# Patient Record
Sex: Female | Born: 1975 | Race: Black or African American | Hispanic: No | Marital: Married | Smoking: Never smoker
Health system: Southern US, Community
[De-identification: ages and names within clinical notes are randomized; demographics above are authoritative.]

## PROBLEM LIST (undated history)

## (undated) DIAGNOSIS — J45909 Unspecified asthma, uncomplicated: Secondary | ICD-10-CM

---

## 2005-01-15 ENCOUNTER — Emergency Department (HOSPITAL_COMMUNITY): Admission: EM | Admit: 2005-01-15 | Discharge: 2005-01-15 | Payer: Self-pay | Admitting: Family Medicine

## 2005-02-27 ENCOUNTER — Emergency Department: Payer: Self-pay | Admitting: Emergency Medicine

## 2005-03-23 ENCOUNTER — Emergency Department: Payer: Self-pay | Admitting: Emergency Medicine

## 2005-03-24 ENCOUNTER — Emergency Department (HOSPITAL_COMMUNITY): Admission: EM | Admit: 2005-03-24 | Discharge: 2005-03-24 | Payer: Self-pay | Admitting: Emergency Medicine

## 2005-09-28 ENCOUNTER — Emergency Department (HOSPITAL_COMMUNITY): Admission: EM | Admit: 2005-09-28 | Discharge: 2005-09-28 | Payer: Self-pay | Admitting: Family Medicine

## 2005-11-09 ENCOUNTER — Other Ambulatory Visit: Admission: RE | Admit: 2005-11-09 | Discharge: 2005-11-09 | Payer: Self-pay | Admitting: Obstetrics and Gynecology

## 2009-05-25 ENCOUNTER — Inpatient Hospital Stay (HOSPITAL_COMMUNITY): Admission: RE | Admit: 2009-05-25 | Discharge: 2009-05-28 | Payer: Self-pay | Admitting: Obstetrics and Gynecology

## 2009-05-30 ENCOUNTER — Encounter: Admission: RE | Admit: 2009-05-30 | Discharge: 2009-06-29 | Payer: Self-pay | Admitting: Obstetrics and Gynecology

## 2009-06-30 ENCOUNTER — Encounter: Admission: RE | Admit: 2009-06-30 | Discharge: 2009-07-29 | Payer: Self-pay | Admitting: Obstetrics and Gynecology

## 2009-07-30 ENCOUNTER — Encounter: Admission: RE | Admit: 2009-07-30 | Discharge: 2009-08-29 | Payer: Self-pay | Admitting: Obstetrics and Gynecology

## 2009-08-30 ENCOUNTER — Encounter: Admission: RE | Admit: 2009-08-30 | Discharge: 2009-09-06 | Payer: Self-pay | Admitting: Obstetrics and Gynecology

## 2011-01-13 LAB — RPR: RPR Ser Ql: NONREACTIVE

## 2011-01-13 LAB — CBC
Hemoglobin: 10.9 g/dL — ABNORMAL LOW (ref 12.0–15.0)
MCV: 97 fL (ref 78.0–100.0)
Platelets: 191 10*3/uL (ref 150–400)
RBC: 3.27 MIL/uL — ABNORMAL LOW (ref 3.87–5.11)
RDW: 14.5 % (ref 11.5–15.5)
WBC: 7.6 10*3/uL (ref 4.0–10.5)
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
## 2012-01-03 ENCOUNTER — Ambulatory Visit (INDEPENDENT_AMBULATORY_CARE_PROVIDER_SITE_OTHER): Payer: Managed Care, Other (non HMO) | Admitting: Obstetrics and Gynecology

## 2012-01-03 DIAGNOSIS — Z202 Contact with and (suspected) exposure to infections with a predominantly sexual mode of transmission: Secondary | ICD-10-CM

## 2012-01-03 DIAGNOSIS — Z01419 Encounter for gynecological examination (general) (routine) without abnormal findings: Secondary | ICD-10-CM

## 2012-07-03 ENCOUNTER — Encounter: Payer: Self-pay | Admitting: Emergency Medicine

## 2012-07-03 ENCOUNTER — Emergency Department (INDEPENDENT_AMBULATORY_CARE_PROVIDER_SITE_OTHER)
Admission: EM | Admit: 2012-07-03 | Discharge: 2012-07-03 | Disposition: A | Discharge status: Home / Self Care | Payer: BC Managed Care – PPO | Source: Home / Self Care | Attending: Family Medicine | Admitting: Family Medicine

## 2012-07-03 DIAGNOSIS — S76219A Strain of adductor muscle, fascia and tendon of unspecified thigh, initial encounter: Secondary | ICD-10-CM

## 2012-07-03 DIAGNOSIS — S93409A Sprain of unspecified ligament of unspecified ankle, initial encounter: Secondary | ICD-10-CM

## 2012-07-03 DIAGNOSIS — IMO0002 Reserved for concepts with insufficient information to code with codable children: Secondary | ICD-10-CM

## 2012-07-03 HISTORY — DX: Unspecified asthma, uncomplicated: J45.909

## 2012-07-03 MED ORDER — HYDROCODONE-ACETAMINOPHEN 5-500 MG PO TABS: 1.0000 | ORAL_TABLET | Freq: Every evening | ORAL | Status: AC | PRN

## 2012-07-03 MED ORDER — CYCLOBENZAPRINE HCL 10 MG PO TABS: 10.0000 mg | ORAL_TABLET | Freq: Two times a day (BID) | ORAL | Status: AC | PRN

## 2012-07-03 NOTE — ED Provider Notes (Signed)
History     CSN: [id number]  Arrival date & time 07/03/12  1859   First MD Initiated Contact with Patient 07/03/12 1933      Chief Complaint  Patient presents with  . Leg Pain      HPI Comments: Patient was stepping up onto a curb 8 days ago when she twisted her left ankle, resulting in mild swelling and pain.  This rapidly resolved over the next several days.  She wore high heeled shoes after her ankle pain resolved, and today she developed pain in her left inguinal area and left medial thigh, worse when standing/walking.  Patient is a 36 y.o. female presenting with leg pain. The history is provided by the patient.  Leg Pain  The incident occurred 6 to 12 hours ago. The incident occurred at work. Injury mechanism: previous left ankle sprain. The pain is present in the left thigh. The quality of the pain is described as throbbing. The pain is mild. The pain has been intermittent since onset. Associated symptoms include inability to bear weight. Pertinent negatives include no numbness, no loss of motion, no muscle weakness, no loss of sensation and no tingling. The symptoms are aggravated by bearing weight. She has tried NSAIDs for the symptoms. The treatment provided mild relief.    Past Medical History  Diagnosis Date  . Asthma     History reviewed. No pertinent past surgical history.  No pertinent family history  History  Substance Use Topics  . Smoking status: Never Smoker   . Smokeless tobacco: Not on file  . Alcohol Use: Yes    OB History    Grav Para Term Preterm Abortions TAB SAB Ect Mult Living                  Review of Systems  Neurological: Negative for tingling and numbness.  All other systems reviewed and are negative.    Allergies  Latex  Home Medications   Current Outpatient Rx  Name Route Sig Dispense Refill  . ALBUTEROL SULFATE HFA 108 (90 BASE) MCG/ACT IN AERS Inhalation Inhale 2 puffs into the lungs every 6 (six) hours as needed.    .  CYCLOBENZAPRINE HCL 10 MG PO TABS Oral Take 1 tablet (10 mg total) by mouth 2 (two) times daily as needed for muscle spasms. 20 tablet 0  . HYDROCODONE-ACETAMINOPHEN 5-500 MG PO TABS Oral Take 1 tablet by mouth at bedtime as needed for pain. 10 tablet 0    BP 112/73  Pulse 78  Temp 98 F (36.7 C) (Oral)  Resp 18  Ht 5\' 8"  (1.727 m)  Wt 162 lb (73.483 kg)  BMI 24.63 kg/m2  SpO2 100%  LMP 07/02/2012  Physical Exam  Constitutional: She is oriented to person, place, and time. She appears well-developed and well-nourished. No distress.  HENT:  Head: Atraumatic.  Eyes: Conjunctivae normal are normal. Pupils are equal, round, and reactive to light.  Musculoskeletal: She exhibits tenderness.       Left ankle: Normal.       Left upper leg: She exhibits tenderness. She exhibits no bony tenderness, no swelling, no edema, no deformity and no laceration.       Legs:      There is mild tenderness along medial aspect of left thigh.  Left hip has full range of motion.    Neurological: She is alert and oriented to person, place, and time.  Skin: Skin is warm and dry. No rash noted.  ED Course  Procedures  none      1. Groin strain   2. Ankle sprain, resolving       MDM  Begin Flexeril.  Rx for Lortab at bedtime. Begin Ibuprofen 200mg , 4 tabs every 8 hours with food.  Apply ice pack several times daily.  Begin exercises as per instruction sheets ([facility] information and instruction handout given)  Followup with Dr. Rodney Langton if not improving 7 to 10 days.        Lattie Haw, MD 07/04/12 [phone]

## 2012-07-03 NOTE — ED Notes (Signed)
Reports slipping off curb and twisting left ankle 8 days ago; there was some edema and throbbing but it seemed to be improving. Today she began having sharp pain from left hip joint along thigh to left knee. Took one ibuprofen tablet earlier today. Still has small amount edema ankle; cannot bear weight.

## 2012-07-05 ENCOUNTER — Telehealth: Payer: Self-pay | Admitting: Family Medicine

## 2012-07-16 ENCOUNTER — Telehealth: Payer: Self-pay | Admitting: *Deleted

## 2013-04-13 ENCOUNTER — Other Ambulatory Visit: Payer: Self-pay | Admitting: Obstetrics and Gynecology

## 2013-04-13 DIAGNOSIS — E049 Nontoxic goiter, unspecified: Secondary | ICD-10-CM

## 2013-04-16 ENCOUNTER — Other Ambulatory Visit: Payer: Self-pay

## 2013-04-16 ENCOUNTER — Other Ambulatory Visit: Payer: Self-pay | Admitting: Obstetrics and Gynecology

## 2013-04-16 ENCOUNTER — Ambulatory Visit
Admission: RE | Admit: 2013-04-16 | Discharge: 2013-04-16 | Disposition: A | Discharge status: Home / Self Care | Payer: BC Managed Care – PPO | Source: Ambulatory Visit | Attending: Obstetrics and Gynecology | Admitting: Obstetrics and Gynecology

## 2013-04-16 DIAGNOSIS — E049 Nontoxic goiter, unspecified: Secondary | ICD-10-CM

## 2015-02-03 IMAGING — US US SOFT TISSUE HEAD/NECK
1 series · 14 of 25 positions shown · non-contrast
Comparison: None.

CLINICAL DATA: Enlarged thyroid on physical exam

THYROID ULTRASOUND
TECHNIQUE: Ultrasound examination of the thyroid gland and adjacent
soft tissues was performed.

[Series 1: us soft tissue head/neck · 0.05mm/px · 14 of 32 slices shown]
[im 1/32]
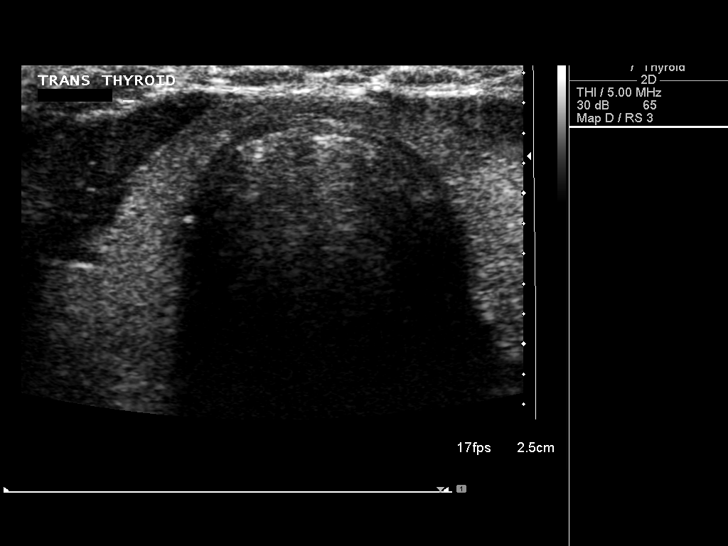
[im 3/32]
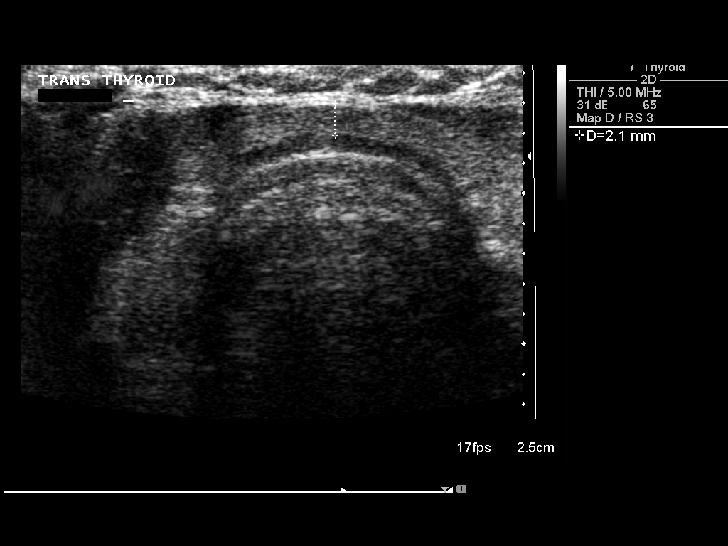
[im 6/32]
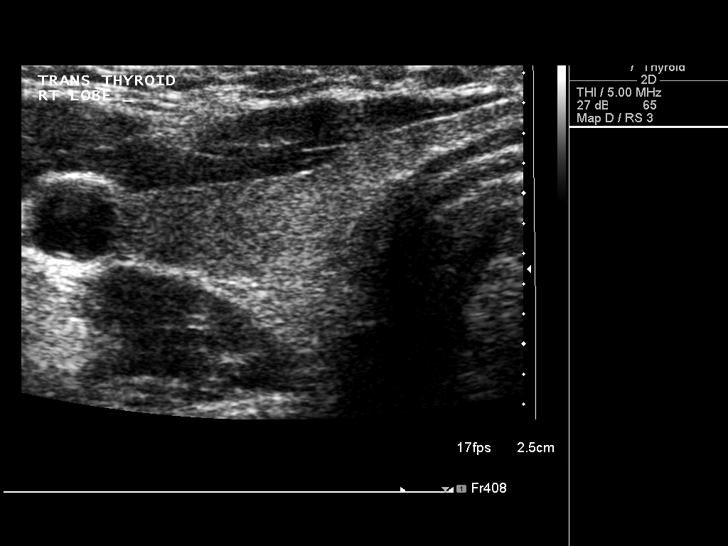
[im 8/32]
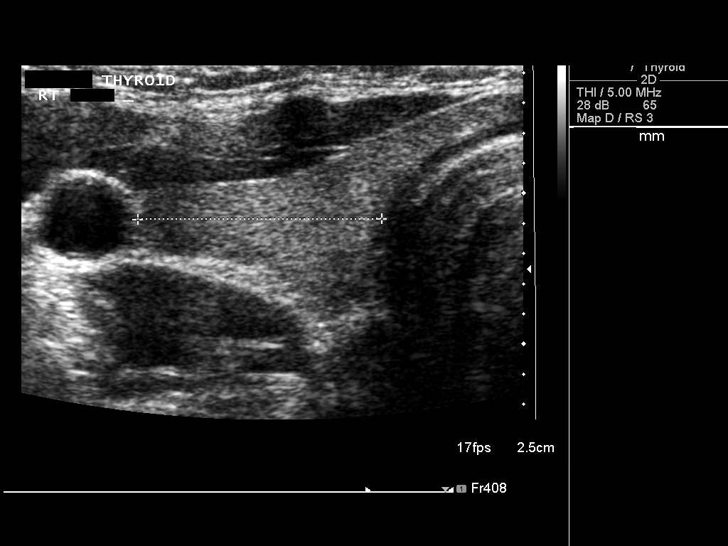
[im 11/32]
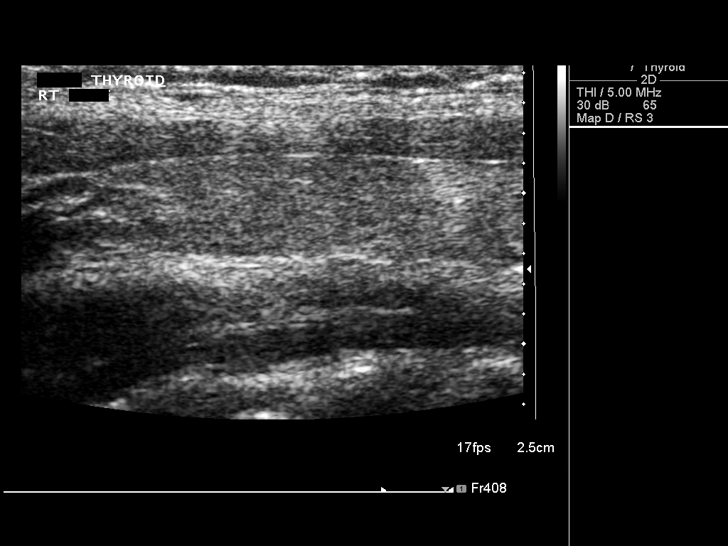
[im 12/32]
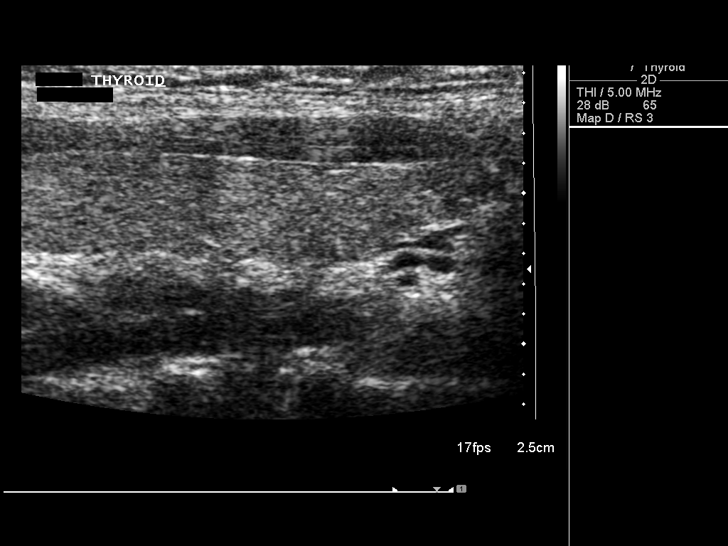
[im 15/32]
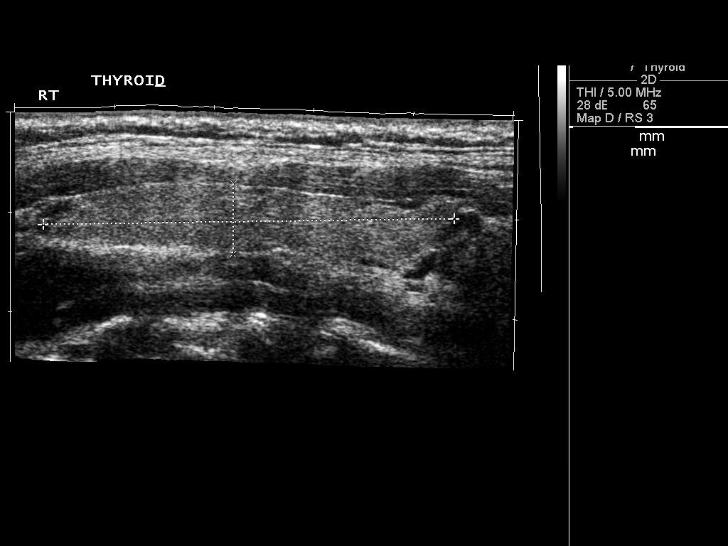
[im 17/32]
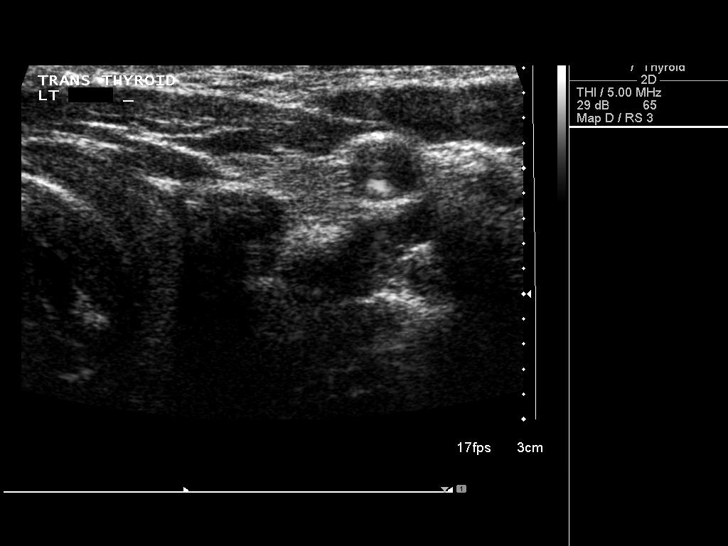
[im 20/32]
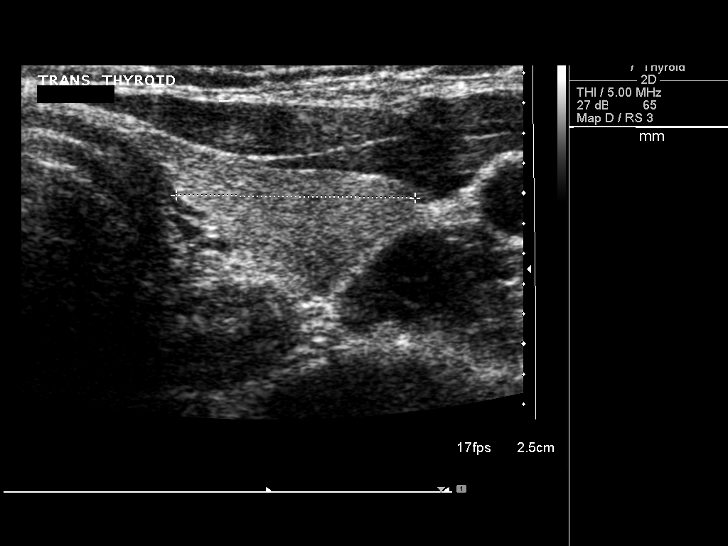
[im 21/32]
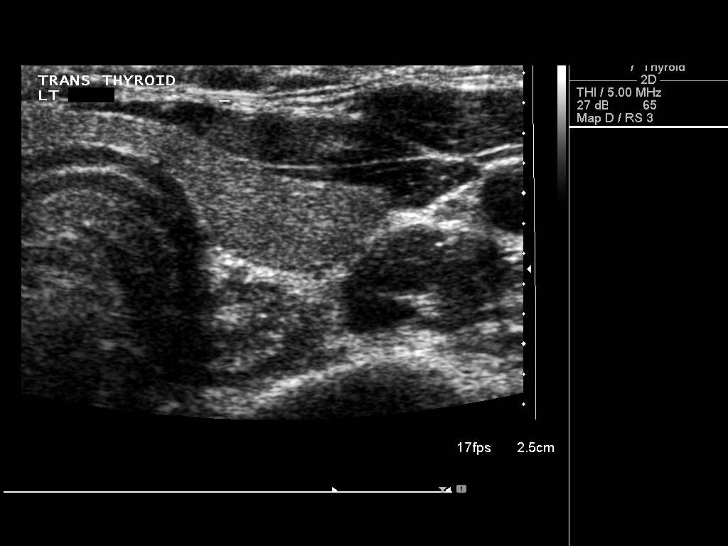
[im 24/32]
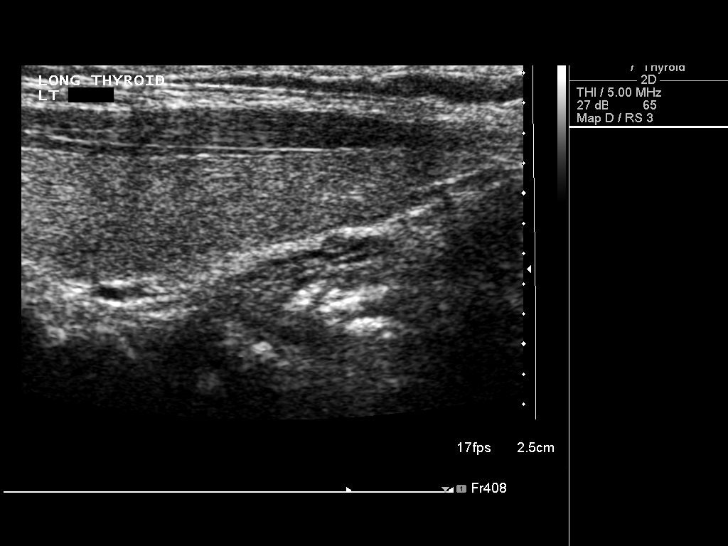
[im 26/32]
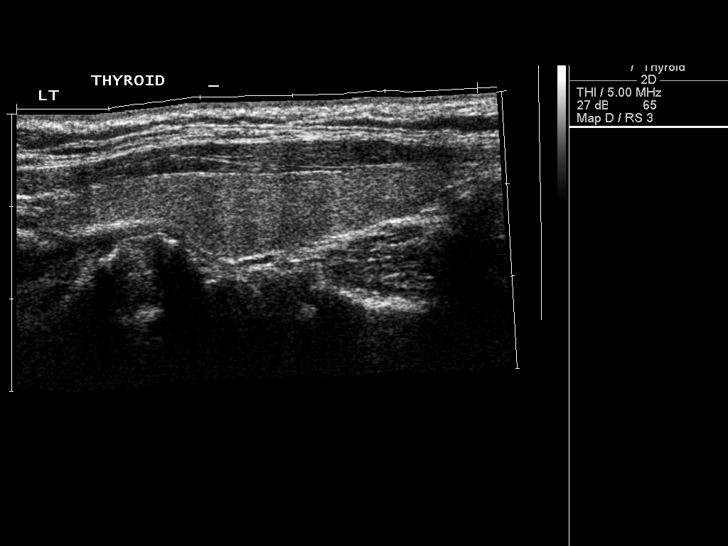
[im 29/32]
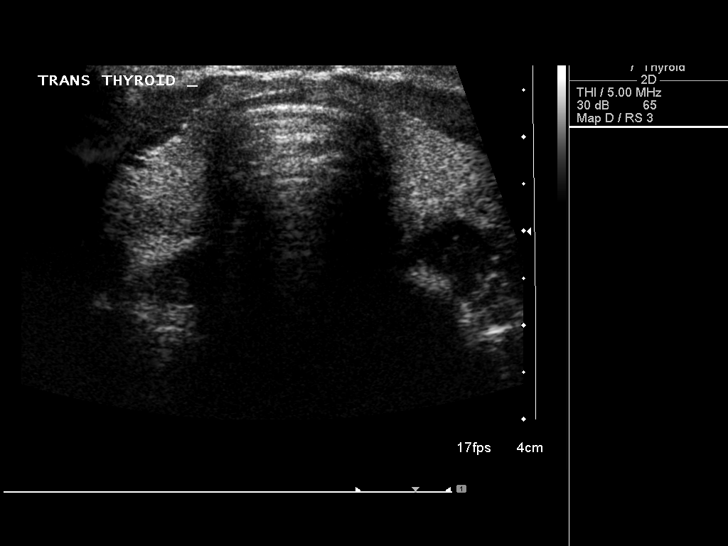
[im 32/32]
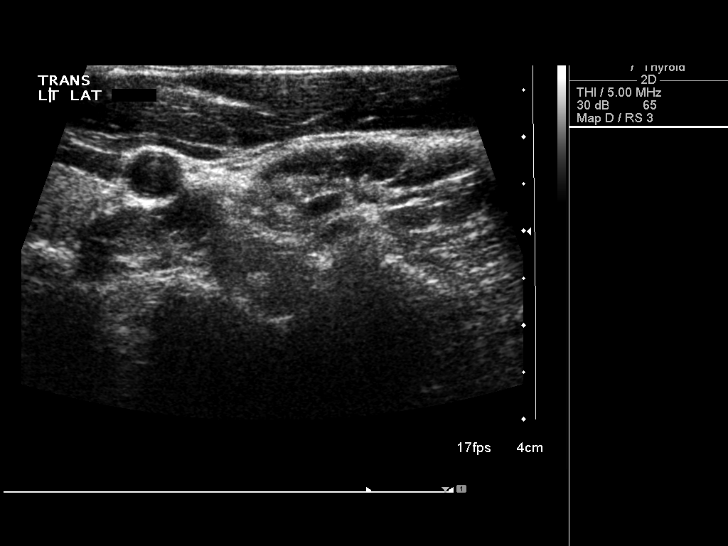

[14 of 25 positions shown; findings below may reference images not displayed]

FINDINGS: Right thyroid lobe:  4.1 x 0.7 x 1.6 cm.
Left thyroid lobe:  4.6 x 1.0 x 1.6 cm.
Isthmus:  2.1 mm in thickness.

Focal nodules:  The echogenicity of the thyroid parenchyma is
homogeneous.  No solid or cystic thyroid nodule is seen.

Lymphadenopathy:  None visualized.
IMPRESSION: Thyroid gland is normal in size.  No solid or cystic thyroid nodule
is noted.

## 2017-01-30 ENCOUNTER — Other Ambulatory Visit: Payer: Self-pay | Admitting: Obstetrics and Gynecology

## 2017-01-30 DIAGNOSIS — N6489 Other specified disorders of breast: Secondary | ICD-10-CM

## 2017-02-04 ENCOUNTER — Ambulatory Visit
Admission: RE | Admit: 2017-02-04 | Discharge: 2017-02-04 | Disposition: A | Discharge status: Home / Self Care | Payer: Managed Care, Other (non HMO) | Source: Ambulatory Visit | Attending: Obstetrics and Gynecology | Admitting: Obstetrics and Gynecology

## 2017-02-04 DIAGNOSIS — N6489 Other specified disorders of breast: Secondary | ICD-10-CM

## 2017-02-05 ENCOUNTER — Other Ambulatory Visit: Payer: Self-pay
